# Patient Record
Sex: Female | Born: 1960 | Race: White | Smoking: Former smoker
Health system: Northeastern US, Academic
[De-identification: ages and names within clinical notes are randomized; demographics above are authoritative.]

## PROBLEM LIST (undated history)

## (undated) DIAGNOSIS — F32A Depression, unspecified: Secondary | ICD-10-CM

## (undated) DIAGNOSIS — M1990 Unspecified osteoarthritis, unspecified site: Secondary | ICD-10-CM

## (undated) DIAGNOSIS — E079 Disorder of thyroid, unspecified: Secondary | ICD-10-CM

## (undated) DIAGNOSIS — E7800 Pure hypercholesterolemia, unspecified: Secondary | ICD-10-CM

---

## 2008-07-09 DIAGNOSIS — F341 Dysthymic disorder: Secondary | ICD-10-CM | POA: Insufficient documentation

## 2009-04-30 ENCOUNTER — Encounter: Payer: Self-pay | Admitting: Gastroenterology

## 2010-03-11 NOTE — Miscellaneous (Unsigned)
Continuity of Care Record  Created: todo  From: Ignacia Felling  From:   From: [employer], EHR [id number]  To: Ova Freshwater, Dustie  Purpose: Patient Use;       Problems  Diagnosis: Dysthymic Disorder (300.4)     Alerts  Allergy - Latex-asked/denied   Allergy - No Known Drug Allergy     Medications  Loestrin 1.5/30 (21) 1.5-30 MG-MCG Tablet; TAKE 1 TABLET DAILY FOR 21 DAYS,   THEN 7 TABLET-FREE DAYS; REPEAT. ; Rx   Microgestin 1.5/30 1.5-30 MG-MCG Tablet; TAKE 1 TABLET BY MOUTH DAILY FOR   21 DAYS , THEN 7 TABLET - FREE DAYS ,THEN REPEAT ; Rx   Microgestin FE 1.5/30 1.5-30 MG-MCG Tablet; TAKE 1 TABLET BY MOUTH DAILY   FOR 21 DAYS , THEN 7 TABLET - FREE DAYS ,THEN REPEAT ; Rx   Non-medication order(s); Referred to orthopedics to evaluate and treat pain   to hep secondary to arthritis ; Rx   Ortho Evra 150-20 MCG/24HR Patch Weekly; APPLY ONE PATCH WEEKLY AS DIRECTED   ; Rx   Sertraline HCl 50 MG Tablet; TAKE 1 TABLET DAILY. ; Rx

## 2013-10-19 ENCOUNTER — Ambulatory Visit: Admit: 2013-10-19 | Discharge: 2013-10-19 | Disposition: A | Discharge status: Home / Self Care | Payer: Self-pay

## 2013-10-19 DIAGNOSIS — J029 Acute pharyngitis, unspecified: Secondary | ICD-10-CM

## 2013-10-19 HISTORY — DX: Disorder of thyroid, unspecified: E07.9

## 2013-10-19 LAB — POCT AMBULATORY RAPID STREP
Lot #: 151181
Rapid Strep Group A Throat-POC: NEGATIVE

## 2013-10-19 LAB — HM HIV SCREENING OFFERED

## 2013-10-19 NOTE — Discharge Instructions (Signed)
:   Patient was advised to utilize good handwashing and monitor signs and symptoms and followup with her primary Dr. if the signs and symptoms worsen or persist a culture will be sent to the lab for confirmation of the negative strep.  Patient take Tylenol or Motrin as needed for any pain or fever.  She should utilize good handwashing and followup with her primary Dr. with any concerns.

## 2013-10-19 NOTE — UC Provider Note (Signed)
History     Chief Complaint   Patient presents with    Sore Throat     HPI Comments: Patient is a 53 year old female who presents to the urgent care today with a sore throat since this morning no fever      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 visit related to civilian activity for income:  Not work related      Past Medical History   Diagnosis Date    Thyroid disease             History reviewed. No pertinent past surgical history.    History reviewed. No pertinent family history.      Social History      reports that she has never smoked. She does not have any smokeless tobacco history on file. She reports that she drinks alcohol. She reports that she does not use illicit drugs. Her sexual activity history is not on file.    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 Negative for fever and chills.   HENT: Positive for congestion, postnasal drip and sore throat. Negative for dental problem, drooling, facial swelling, hearing loss, sneezing and trouble swallowing.    Eyes: Negative for pain, discharge and redness.   Respiratory: Negative for apnea, cough, shortness of breath, wheezing and stridor.    Cardiovascular: Negative for chest pain.   Gastrointestinal: Negative for nausea, vomiting, diarrhea, constipation, blood in stool and abdominal distention.   Endocrine: Negative for cold intolerance.   Genitourinary: Negative for frequency and difficulty urinating.   Musculoskeletal: Negative for back pain, neck pain and neck stiffness.   Skin: Negative for color change and rash.   Allergic/Immunologic: Negative for environmental allergies.   Neurological: Negative for dizziness, seizures, speech difficulty, weakness, numbness and  headaches.   Psychiatric/Behavioral: Negative for sleep disturbance and agitation. The patient is not nervous/anxious.    All other systems reviewed and are negative.      Physical Exam     ED Triage Vitals   BP Pulse Heart Rate(via Pulse Ox) Resp Temp Temp Source SpO2 O2 Device O2 Flow Rate   10/19/13 1039 -- -- 10/19/13 1039 10/19/13 1039 10/19/13 1039 10/19/13 1039 10/19/13 1039 --   113/80 mmHg   10 36.8 C (98.2 F) Oral 98 % None (Room air)       Weight           10/19/13 1039           70.308 kg (155 lb)               Physical Exam   Constitutional: She is oriented to person, place, and time. She appears well-developed and well-nourished.   HENT:   Head: Normocephalic and atraumatic.   Mouth/Throat: No oropharyngeal exudate.   Scant nasal congestion when postnasal drip.  The posterior pharynx appears with mild erythema no exudate noted.  Neck is supple with full range of motion.  Anterior cervical lymphadenopathy is noted with mild tenderness with palpation no rigidity of the neck.   Eyes: Conjunctivae and EOM  are normal. Pupils are equal, round, and reactive to light. Right eye exhibits no discharge. Left eye exhibits no discharge.   Neck: Normal range of motion. Neck supple. No JVD present.   Cardiovascular: Normal rate, regular rhythm, normal heart sounds and intact distal pulses.    Pulmonary/Chest: Effort normal and breath sounds normal. No stridor. No respiratory distress. She has no wheezes.   Abdominal: Soft. Bowel sounds are normal. She exhibits no distension. There is no tenderness. There is no rebound.   Musculoskeletal: Normal range of motion.   Lymphadenopathy:     She has cervical adenopathy.   Neurological: She is alert and oriented to person, place, and time. She has normal reflexes.   Skin: Skin is warm.   Psychiatric: She has a normal mood and affect. Her behavior is normal. Judgment and thought content normal.   Nursing note and vitals reviewed.      Medical Decision Making      Amount  and/or Complexity of Data Reviewed  Clinical lab tests: ordered and reviewed (Habits strep test was ordered and reviewed culture will be sent)        Initial Evaluation:  ED First Provider Contact     Date/Time Event User Comments    10/19/13 1036 ED Provider First Contact Selina CooleyOURTNEY, Samael Blades Initial Face to Face Provider Contact          Patient seen by me as above    Assessment:  53 y.o., female comes to the [facility] Care [facility] with scant nasal congestion and sore throat since this morning.  Patient has no fever on exam she is scant congestion with postnasal drip, mild erythema in the posterior pharynx no exudate noted.  Tonsils are +1 with mild erythema.  Neck is supple with full range of motion there is anterior cervical lymphadenopathy is noted but no rigidity mild tenderness to the touch.  Heart sounds are normal no murmur, chest is clear to air.  Abdomen soft nontender nondistended with good bowel sounds.    Differential Diagnosis includes   Viral pharyngitis  Strep pharyngitis  Viral syndrome             Plan: Patient was advised to utilize good handwashing and monitor signs and symptoms and followup with her primary Dr. if the signs and symptoms worsen or persist a culture will be sent to the lab for confirmation of the negative strep.  Patient take Tylenol or Motrin as needed for any pain or fever.  She should utilize good handwashing and followup with her primary Dr. with any concerns.    Selina CooleyKathleen Azlynn Mitnick, NP

## 2013-10-19 NOTE — ED Notes (Signed)
States she began with sore throat this AM, thought she had allergies last eve, took allergy pill. Has not taken temp.  No meds taken

## 2013-10-21 LAB — STREP A CULTURE, THROAT: Group A Strep Throat Culture: 0

## 2014-05-29 ENCOUNTER — Encounter: Payer: Self-pay | Admitting: Emergency Medicine

## 2014-05-29 ENCOUNTER — Ambulatory Visit: Admit: 2014-05-29 | Discharge: 2014-05-29 | Disposition: A | Discharge status: Home / Self Care | Payer: Self-pay

## 2014-05-29 DIAGNOSIS — B001 Herpesviral vesicular dermatitis: Secondary | ICD-10-CM

## 2014-05-29 DIAGNOSIS — R519 Headache, unspecified: Secondary | ICD-10-CM

## 2014-05-29 LAB — HM HIV SCREENING OFFERED

## 2014-05-29 MED ORDER — VALACYCLOVIR HCL 1000 MG PO TABS *I*
1000.0000 mg | ORAL_TABLET | Freq: Three times a day (TID) | ORAL | Status: DC
Start: 2014-05-29 — End: 2014-05-30

## 2014-05-29 NOTE — ED Notes (Signed)
C/o "intermittant,stabbing headache" since Sun [place] left occiput area. No meds this AM, has been taking excedrine and  Something her daughter gave her.

## 2014-05-29 NOTE — UC Provider Note (Signed)
History     Chief Complaint   Patient presents with    Headache     HPI Comments: Patient is a 54 year old female who presents to the urgent care today with a headache.  The headache comes on intermittently and quickly last 2-3 seconds and then goes away she also has sensitivity of her scalp on the left side.  She has one lesion in the center of the scalp in the occipital region that she says it's been there for a long period of time.  The left side of the head is presented to the touch.      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 visit related to civilian activity for income:  Not work related      Past Medical History   Diagnosis Date    Thyroid disease             No past surgical history on file.    No family history on file.      Social History      reports that she has never smoked. She does not have any smokeless tobacco history on file. She reports that she drinks alcohol. She reports that she does not use illicit drugs. Her sexual activity history is not on file.    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 Negative for fever and chills.   HENT: Positive for congestion and ear pain. Negative for dental problem, drooling, facial swelling, hearing loss, sneezing, sore throat and trouble swallowing.    Eyes: Negative for pain, discharge and redness.   Respiratory: Negative for apnea, cough, shortness of breath, wheezing and stridor.    Cardiovascular: Negative for chest pain.   Gastrointestinal: Negative for nausea, vomiting, diarrhea, constipation, blood in stool and abdominal distention.   Endocrine: Negative for cold intolerance.   Genitourinary: Negative for frequency and difficulty urinating.   Musculoskeletal: Negative  for back pain, neck pain and neck stiffness.   Skin: Positive for wound (patient has a cold sore on the left lower lip). Negative for color change and rash.   Allergic/Immunologic: Negative for environmental allergies.   Neurological: Negative for dizziness, seizures, speech difficulty, weakness, numbness and headaches.   Psychiatric/Behavioral: Negative for sleep disturbance and agitation. The patient is not nervous/anxious.    All other systems reviewed and are negative.      Physical Exam     ED Triage Vitals   BP Pulse Heart Rate (via Pulse Ox) Resp Temp Temp src SpO2 O2 Device O2 Flow Rate   05/29/14 1018 -- 05/29/14 1018 05/29/14 1018 05/29/14 1018 05/29/14 1018 05/29/14 1018 05/29/14 1018 --   109/70 mmHg  67 12 36.8 C (98.2 F) Oral 97 % None (Room air)       Weight           05/29/14 1018           72.576 kg (160 lb)               Physical Exam   Constitutional: She is oriented to person, place, and  time. She appears well-developed and well-nourished. She appears distressed (during the period that the pain comes in the head patient is wincing).   HENT:   Head: Normocephalic and atraumatic.   Right Ear: External ear normal.   Nose: Nose normal.   Mouth/Throat: Oropharynx is clear and moist. No oropharyngeal exudate.   2 slight postnasal drip along with fluid behind each of the eardrums of light is present bilaterally.  No erythema.   Eyes: Conjunctivae and EOM are normal. Pupils are equal, round, and reactive to light. Right eye exhibits no discharge. Left eye exhibits no discharge.   Neck: Normal range of motion. Neck supple. No JVD present.   Cardiovascular: Normal rate, regular rhythm, normal heart sounds and intact distal pulses.    Pulmonary/Chest: Effort normal and breath sounds normal. No stridor. No respiratory distress. She has no wheezes.   Abdominal: Soft. Bowel sounds are normal. She exhibits no distension and no mass. There is no tenderness. There is no rebound and no guarding.    Musculoskeletal: Normal range of motion.   Lymphadenopathy:     She has no cervical adenopathy.   Neurological: She is alert and oriented to person, place, and time. She has normal reflexes.   Intermittent pain on the left side of the scalp.  The area hurts when touched there is no lesions present   Skin: Skin is warm.   Psychiatric: She has a normal mood and affect. Her behavior is normal. Judgment and thought content normal.   Nursing note and vitals reviewed.      Medical Decision Making        Initial Evaluation:  ED First Provider Contact     Date/Time Event User Comments    05/29/14 1026 ED Provider First Contact Selina Cooley Initial Face to Face Provider Contact          Patient seen by me as above    Assessment:  54 y.o., female comes to the Urgent Care Center with intermittent pain in the head that lasts only a matter of seconds but calms 2-3 times a minute.  This is a sharp stabbing burning type pain patient states that the left-sided scalp was also sensitive to touch.  Patient also has a cold sore on the left lower lip.  TMs have fluid behind them bilaterally she has slight nasal congestion also    Differential Diagnosis includes   Stress  Possible arterial or vascular malformation  Herpes simplex 1  Shingles  Neuropathic pain               Plan: Patient was advised that she should go the emergency department for further evaluation and treatment of the head pain she is experiencing.  She'll also be started on a course of Valtrex for the cold sore.  She'll take that 3 times a day for one week.  She should monitor the symptoms closely she was advised that our recommendation would be to go by ambulance she declined a recommendation and opted to transport herself via private vehicle.  Patient will go directly to emergency department for further evaluation and treatment.      Selina Cooley, NP

## 2014-05-29 NOTE — Discharge Instructions (Signed)
Patient was advised that she should go the emergency department for further evaluation and treatment of the head pain she is experiencing.  She'll also be started on a course of Valtrex for the cold sore.  She'll take that 3 times a day for one week.  She should monitor the symptoms closely she was advised that our recommendation would be to go by ambulance she declined a recommendation and opted to transport herself via private vehicle.  Patient will go directly to emergency department for further evaluation and treatment.

## 2014-05-30 MED ORDER — VALACYCLOVIR HCL 1000 MG PO TABS *I*
1000.0000 mg | ORAL_TABLET | Freq: Three times a day (TID) | ORAL | Status: AC
Start: 2014-05-30 — End: 2014-06-06

## 2015-01-03 ENCOUNTER — Ambulatory Visit
Admission: AD | Admit: 2015-01-03 | Discharge: 2015-01-03 | Disposition: A | Discharge status: Home / Self Care | Payer: Self-pay

## 2015-01-03 DIAGNOSIS — S20211A Contusion of right front wall of thorax, initial encounter: Secondary | ICD-10-CM

## 2015-01-03 HISTORY — DX: Pure hypercholesterolemia, unspecified: E78.00

## 2015-01-03 HISTORY — DX: Depression, unspecified: F32.A

## 2015-01-03 HISTORY — DX: Unspecified osteoarthritis, unspecified site: M19.90

## 2015-01-03 NOTE — ED Triage Notes (Signed)
States she fell 11/2 hr ago  After tipping off of sidewalk. C/o rightlateral rib pain, no other njury. Pt refused ambulance at time.  Ibuprofen 400 mgm 20 min ago       Triage Note   [name], RN

## 2015-01-03 NOTE — Discharge Instructions (Signed)
Return for worsening pain, shortness of breath or other concerns  Follow up with PCP in 1-2 days  Motrin 600mg  every 6 hours as needed with food  Warm heat to sore areas 2-3 times a day  Encourage coughing and deep breathing to avoid splinting and getting pneumonia.

## 2015-01-03 NOTE — ED Notes (Signed)
Upon disch, pt c/o left foot tingling, has had tingling in past, not this much. Does not want to see provider for same because she will see her own MD in a few days and provider will say it is the arthritis. Pt feels her body is swelling and will just take more ibuprofen

## 2015-01-03 NOTE — ED Triage Notes (Signed)
Pt keeps stating the BP cuff hurts her arm and fingers are numb while cuff inflated. Pt has rambling speech and states pain worse with movement       Triage Note   Cherlyn LabellaEileen B Brenten Janney, RN

## 2015-01-03 NOTE — UC Provider Note (Signed)
History     Chief Complaint   Patient presents with    Rib Injury     HPI Comments: This is a 54 y/o female PMH OA, hypothyroidism, that presents with right rib cage pain, s/p fall 1 hour PTA. PAtient reports she was on curb and fell striking right side of chest on curb. Denies striking her head, no LOC. C/O pain with movement, inspiration. Denies SOB. Denies HA, no N/V/. Denies vomiting. No other c/o pain, patient concerned as supposed to fly back to [place] this evening.       History provided by:  Patient      Past Medical History   Diagnosis Date    Arthritis     Depression     High cholesterol     Thyroid disease             History reviewed. No pertinent past surgical history.    No family history on file.      Social History    reports that she has quit smoking. She does not have any smokeless tobacco history on file. She reports that she drinks alcohol. She reports that she does not use illicit drugs. Her sexual activity history is not on file.    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 Negative for chills and fever.   HENT: Negative for congestion.    Respiratory: Negative for cough and shortness of breath.    Cardiovascular: Positive for chest pain.        Right chest wall pain   Gastrointestinal: Negative for abdominal pain, nausea and vomiting.   Genitourinary: Negative for difficulty urinating.   Musculoskeletal: Negative for back pain and neck pain.   Skin: Negative for color change, rash and wound.   Neurological: Negative for dizziness, syncope and light-headedness.   Hematological: Does not bruise/bleed easily.   Psychiatric/Behavioral: Negative for confusion.       Physical Exam     ED Triage Vitals   BP Heart Rate Heart Rate (via Pulse Ox)  Resp Temp Temp src SpO2 O2 Device O2 Flow Rate   01/03/15 1144 01/03/15 1144 01/03/15 1144 01/03/15 1144 01/03/15 1144 01/03/15 1144 01/03/15 1144 01/03/15 1144 --   129/81 66 66 12 36.6 C (97.9 F) Oral 99 % None (Room air)       Weight           01/03/15 1144           71.2 kg (157 lb)               Physical Exam   Constitutional: She is oriented to person, place, and time. She appears well-developed and well-nourished. No distress.   HENT:   Head: Normocephalic and atraumatic.   Mouth/Throat: Oropharynx is clear and moist.   Eyes: EOM are normal. Pupils are equal, round, and reactive to light.   Neck: No JVD present.   Cardiovascular: Normal rate, regular rhythm, normal heart sounds and intact distal pulses.        Pulmonary/Chest: Effort normal and breath sounds normal. No respiratory distress. She exhibits tenderness.   Abdominal:  Soft. She exhibits no mass. There is no tenderness. There is no rebound and no guarding. No hernia.   Musculoskeletal: Normal range of motion.   Neurological: She is alert and oriented to person, place, and time.   Skin: Skin is warm and dry.   Psychiatric: She has a normal mood and affect.   Nursing note and vitals reviewed.       Medical Decision Making      Amount and/or Complexity of Data Reviewed  Tests in the radiology section of CPT: ordered        Initial Evaluation:  ED First Provider Contact     Date/Time Event User Comments    01/03/15 1141 ED Provider First Contact Katherene PontoFENTON, ANGELA E Initial Face to Face Provider Contact          Patient seen by me as above    Assessment:  54 y.o.female comes to the Urgent Care Center with right rib cage pain s/p fall one hour PTA. Denies striking head, no LOC. PAin with movement, no SOB. Not hypoxic. No other injuries present.    Differential Diagnosis includes   PTX  Rib Fx  Rib cage contusion                Plan:   EXAM  Motrin   ICE  FU PCP  CONSIDER MUSCLE RELAXANTS    Final Diagnosis  Final diagnoses:   None           Basilia JumboANGIE Naren Benally,  NP    Supervising physician MATTHEW SPENCER was immediately available    CXR negative for rib fx, PTX. Declined muscle relaxants, motrin PRN. FU PCP.      Basilia JumboFenton, Dessa Ledee, NP  01/03/15 1233

## 2015-08-26 ENCOUNTER — Ambulatory Visit
Admission: AD | Admit: 2015-08-26 | Discharge: 2015-08-26 | Disposition: A | Discharge status: Home / Self Care | Payer: Self-pay

## 2015-08-26 DIAGNOSIS — S5002XA Contusion of left elbow, initial encounter: Secondary | ICD-10-CM

## 2015-08-26 DIAGNOSIS — S40012A Contusion of left shoulder, initial encounter: Secondary | ICD-10-CM

## 2015-08-26 NOTE — Discharge Instructions (Signed)
Recommend otc Tylenol and/or Motrin as needed for pain/fever relief. Use as directed on package labeling.

## 2015-08-26 NOTE — ED Notes (Signed)
Pt given a sling to wear when she got home and was sized for her  And ice applied

## 2015-08-26 NOTE — UC Provider Note (Signed)
History     Chief Complaint   Patient presents with    Fall     Patient is a 55 y.o. female presenting with arm injury.   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 visit related to civilian activity for income:  Not work related  Arm Injury   Location:  Elbow and shoulder  Shoulder location:  L shoulder  Elbow location:  L elbow  Injury: yes    Time since incident:  1 hour  Mechanism of injury comment:  Patient fell on a toy and landed on her left arm at home today  Pain details:     Quality:  Aching    Radiates to:  L fingers    Severity:  Moderate    Onset quality:  Sudden    Timing:  Constant    Progression:  Improving  Handedness:  Right-handed  Dislocation: no    Foreign body present:  No foreign bodies  Prior injury to area:  No  Relieved by:  Rest  Worsened by:  Movement  Associated symptoms: decreased range of motion, numbness, stiffness and tingling    Associated symptoms: no back pain, no fatigue, no fever, no muscle weakness, no neck pain and no swelling        Past Medical History:   Diagnosis Date    Arthritis     Depression     High cholesterol     Thyroid disease             History reviewed. No pertinent surgical history.    History reviewed. No pertinent family history.      Social History    reports that she has quit smoking. She has never used smokeless tobacco. She reports that she drinks alcohol. She reports that she does not use illicit drugs. Her sexual activity history is not on file.    Living Situation     Questions Responses    Patient lives with Significant Other    Homeless     Caregiver for other family member     External Services     Employment     Domestic Violence Risk           Review of Systems   Review of Systems   Constitutional: Negative for fatigue and fever.   Musculoskeletal: Positive for stiffness. Negative for back pain and neck pain.        Left shoulder and elbow pain   Neurological: Positive for numbness (in left fingers).   All  other systems reviewed and are negative.      Physical Exam     ED Triage Vitals   BP Heart Rate Heart Rate (via Pulse Ox) Resp Temp Temp src SpO2 O2 Device O2 Flow Rate   08/26/15 1555 08/26/15 1555 -- 08/26/15 1555 08/26/15 1555 08/26/15 1555 08/26/15 1555 08/26/15 1555 --   126/58 67  16 37.6 C (99.7 F) TEMPORAL 96 % None (Room air)       Weight           08/26/15 1555           72.6 kg (160 lb)                    Physical Exam   Constitutional: She is oriented to person, place, and time. She appears well-developed and well-nourished.   HENT:   Head: Normocephalic and atraumatic.   Cardiovascular: Intact  distal pulses.    Pulmonary/Chest: Effort normal.   Musculoskeletal:        Left shoulder: She exhibits decreased range of motion, bony tenderness and pain. She exhibits no swelling, no effusion, no crepitus, no deformity, no spasm, normal pulse and normal strength.        Left elbow: She exhibits decreased range of motion. She exhibits no swelling, no effusion, no deformity and no laceration. Tenderness found. Lateral epicondyle and olecranon process tenderness noted.        Thoracic back: Normal.        Lumbar back: Normal.        Left hand: Normal.   Neurological: She is alert and oriented to person, place, and time. She has normal strength. No sensory deficit.   Skin: Skin is warm and dry. No rash noted.   Psychiatric: She has a normal mood and affect. Her behavior is normal. Judgment and thought content normal.   Nursing note and vitals reviewed.       Medical Decision Making      Amount and/or Complexity of Data Reviewed  Tests in the radiology section of CPT: ordered and reviewed  Independent visualization of images, tracings, or specimens: yes        Initial Evaluation:  ED First Provider Contact     Date/Time Event User Comments    08/26/15 1606 ED Provider First Contact Alysia PennaUIZ, Romney Compean M Initial Face to Face Provider Contact          Patient was seen on: 08/26/2015        Assessment:  55 y.o.female [facility]  [facility] with left elbow and shoulder pain x 1 hour due to a fall onto her left arm.    Differential Diagnosis includes Contusion, Fracture, Ligamentous sprain, Tendon Strain                 Plan:   Orders Placed This Encounter    Sling & swathe    * Elbow LEFT standard AP, Lateral,  and both Obliques    * Shoulder LEFT standard AP, Grashey, and Lateral views     Orders Placed This Encounter   Procedures    Sling & swathe     Standing Status:   Standing     Number of Occurrences:   1    * Elbow LEFT standard AP, Lateral,  and both Obliques     Standing Status:   Standing     Number of Occurrences:   1     Order Specific Question:   Signs and symptoms/indications     Answer:   pain x 1 hour after fall     Order Specific Question:   Patient pregnant     Answer:   No    * Shoulder LEFT standard AP, Grashey, and Lateral views     Standing Status:   Standing     Number of Occurrences:   1     Order Specific Question:   Signs and symptoms/indications     Answer:   pain x 1 hour after fall     Order Specific Question:   Patient pregnant     Answer:   No       Xrays ordered and reviewed - no obvious acute fracture or dislocation; official radiology read pending at time of discharge, any deviation from plan at time of discharge will be conveyed telephonically.     Final Diagnosis    ICD-10-CM ICD-9-CM  1. Contusion of shoulder, left S40.012A 923.00   2. Contusion of elbow, left S50.02XA 923.11       Encourage fluids, encourage rest, good hand hygiene.    Use over the counter medications as discussed. Recommend otc Tylenol and/or Motrin as needed for pain/fever relief. Use as directed on package labeling.      Recommend rest, ice, elevation and compression of the left arm.    Please follow up with your physician as below:    Follow-up Information     Follow [facility] with [facility].    Specialty:  Emergency Medicine    Why:  As needed    Contact information:    [address]  [place]  [place] [id number]  [phone]        If short of breath, chest pains or any other concerns please report to the emergency room.    In the event of an Emergency dial 911.     Final Diagnosis  Final diagnoses:   [S40.012A] Contusion of shoulder, left (Primary)   [S50.02XA] Contusion of elbow, left           Concha PyoLeah M Marveen Donlon, PA    Supervising physician Dr. Dorna BloomSuzanne Brendze was immediately available     Concha PyoRuiz, Reneka Nebergall M, [place]  08/26/15 1644

## 2015-08-26 NOTE — ED Triage Notes (Signed)
Pt states that she fell backwards over a toy and landed on her left side.  She states that her left shoulder and left elbow are painful and her left forearm and left hand feel numb and tingling and did not think that she could move her fingers but realized that she could move her fingers. She does also complain that she has a pain on her left mid to upper left abd but she had a hot dog with hot meat sauce for lunch and she is not use to eating that stuff.  She also c/o that her left buttocks hurts Pt ambulated well. Ice applied now  None before.       Triage Note   Randell LoopEllen L Meegan Shanafelt, RN

## 2015-09-02 ENCOUNTER — Telehealth: Payer: Self-pay

## 2015-09-02 NOTE — Telephone Encounter (Signed)
PAST 5 DAYS OF SERVICE

## 2022-12-15 IMAGING — MR MRI LUMBAR SPINE WITHOUT CONTRAST
4 series · 48 of 48 positions shown · IV contrast (Off)
Comparison: Exam compared to prior exam from our facility dated 05/18/2019.

MRI OF THE LUMBAR SPINE WITHOUT CONTRAST
CLINICAL HISTORY: Chronic low back pain, right hip and leg pain with numbness.
TECHNIQUE: Multisequential multiplanar imaging was performed of the lumbar spinal region.

[Series 1: z s-c scano · coronal · 6.0mm · 1.17mm/px · 5 of 6 slices shown]
[im 1/6]
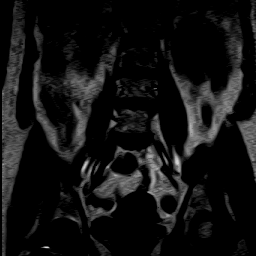
[im 2/6]
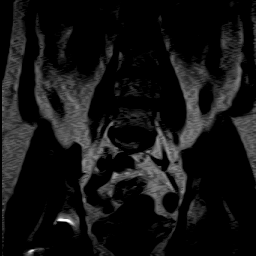
[im 3/6]
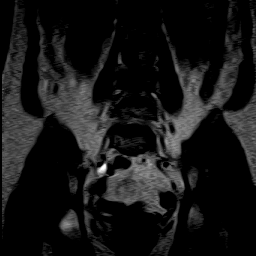
[im 4/6]
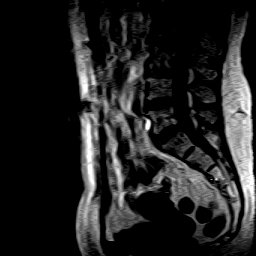
[im 6/6]
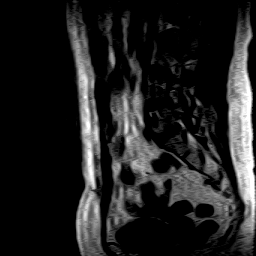

[Series 2: T2 · sagittal · 5.0mm · 1.13mm/px · 10 of 13 slices shown (1 of 2)]
[im 1/13]
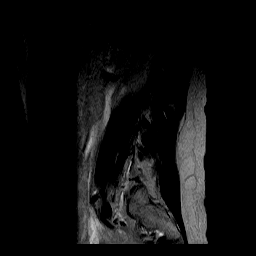
[im 2/13]
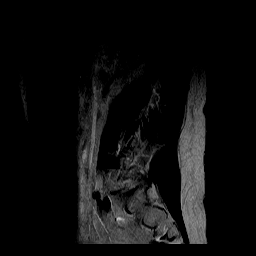
[im 3/13]
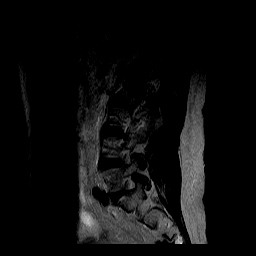
[im 5/13]
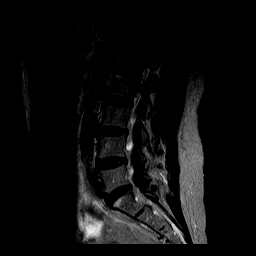
[im 6/13]
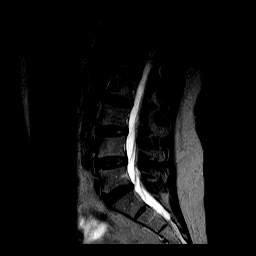
[im 7/13]
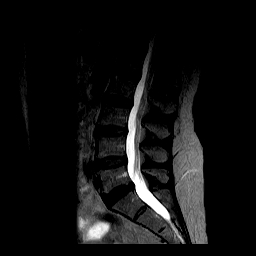
[im 9/13]
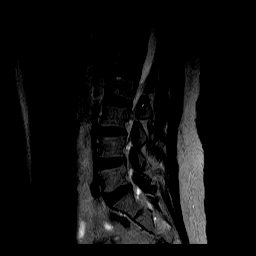
[im 10/13]
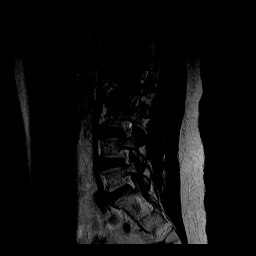
[im 11/13]
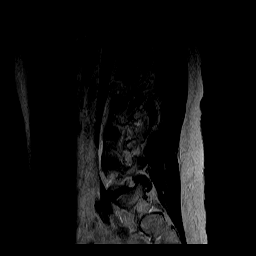
[im 13/13]
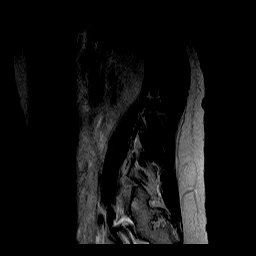

[Series 3: T1 · sagittal · 5.0mm · 1.13mm/px · 10 of 13 slices shown]
[im 1/13]
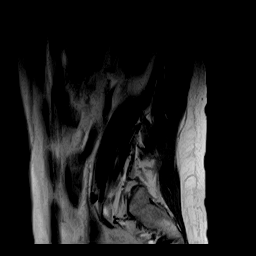
[im 2/13]
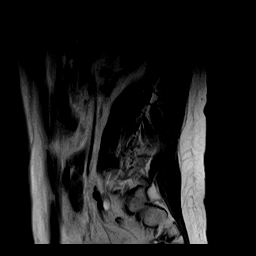
[im 3/13]
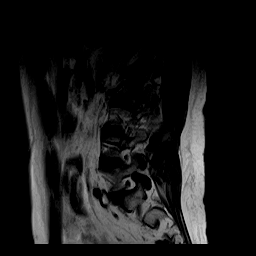
[im 5/13]
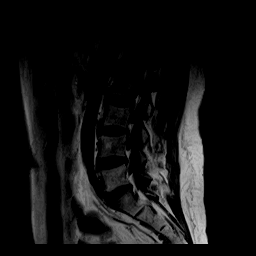
[im 6/13]
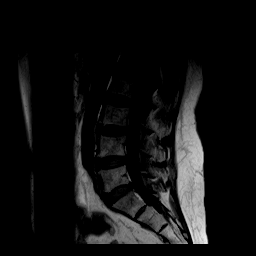
[im 7/13]
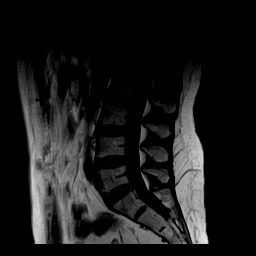
[im 9/13]
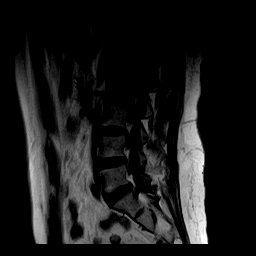
[im 10/13]
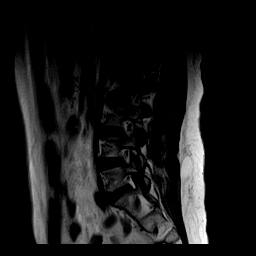
[im 11/13]
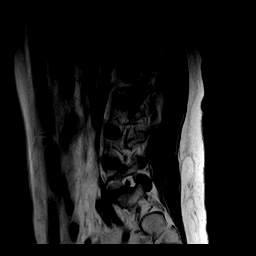
[im 13/13]
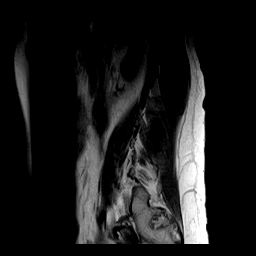

[Series 5: T2 · axial · 4.0mm · 1.25mm/px · z∈[-59,+161]mm · 23 of 29 slices shown (2 of 2)]
[im 1/29]
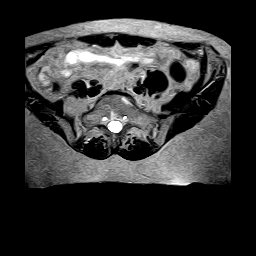
[im 2/29]
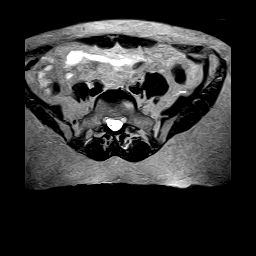
[im 3/29]
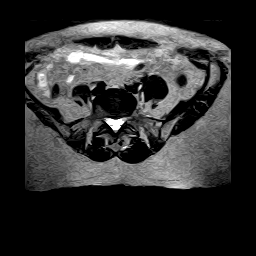
[im 4/29]
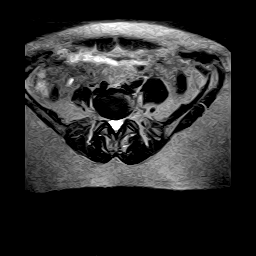
[im 6/29]
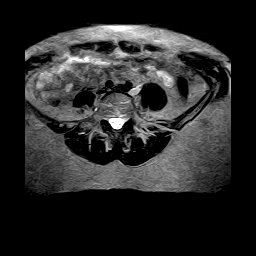
[im 7/29]
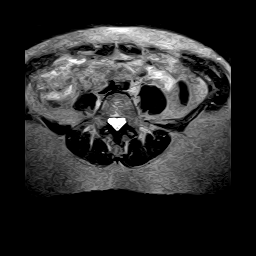
[im 8/29]
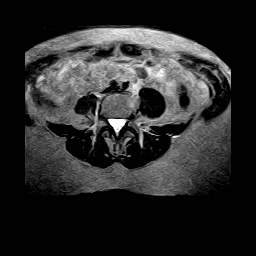
[im 9/29]
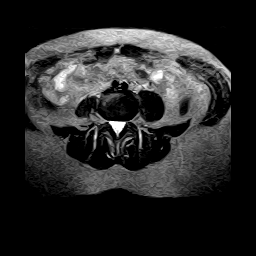
[im 11/29]
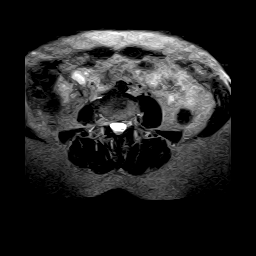
[im 12/29]
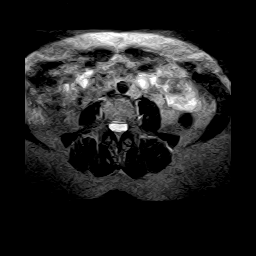
[im 13/29]
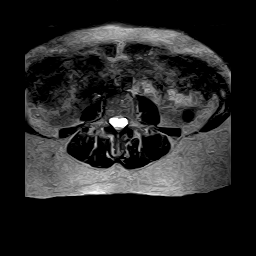
[im 15/29]
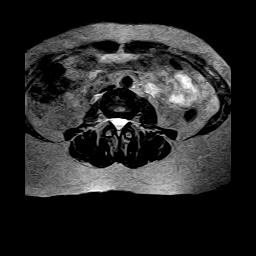
[im 16/29]
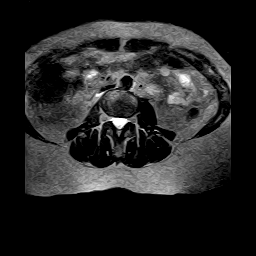
[im 17/29]
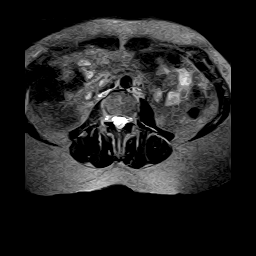
[im 18/29]
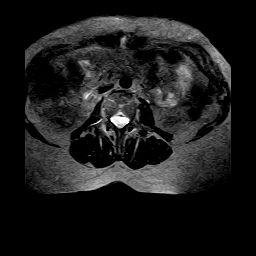
[im 20/29]
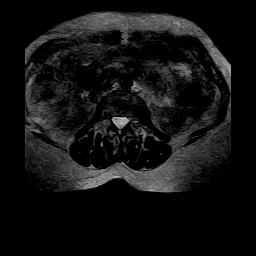
[im 21/29]
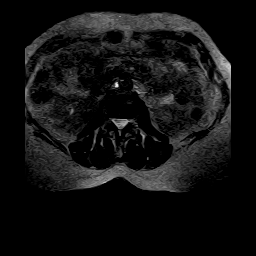
[im 22/29]
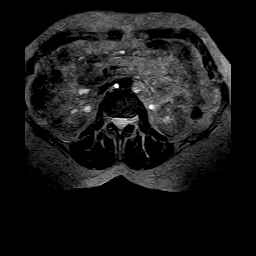
[im 23/29]
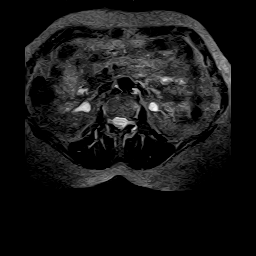
[im 25/29]
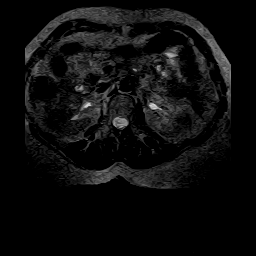
[im 26/29]
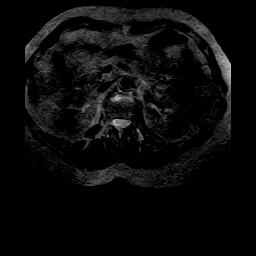
[im 27/29]
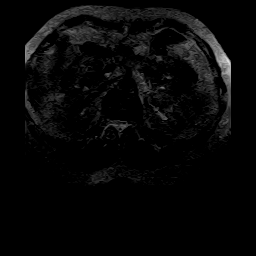
[im 29/29]
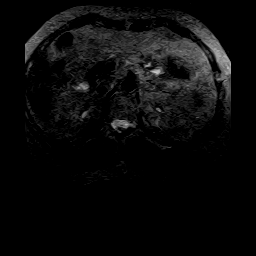

[48 of 48 positions shown; findings below may reference images not displayed]

FINDINGS: There is a normal marrow signal noted throughout the lumbar vertebral bodies. The conus medullaris is unremarkable and there is no obvious intradural abnormality noted. There is moderate facet arthrosis and ligamentum flavum hypertrophy throughout the lumbar spinal region. 

At L1-L2 level, there is desiccation and 2.5 mm bulging. 

At L2-L3 level, there is 2 mm bulging. 

At L3-L4 level, there is 1.5 mm bulging and borderline to early spinal stenosis. 

At L4-L5 level, there is 1.5 mm bulging and early spinal stenosis. 

At L5-S1 level, there is desiccation and 3 to 3.5 mm herniation asymmetric toward the right side with mild effacement of the entrance to the right neural foramen. Mild to moderate spinal stenosis.
IMPRESSION: Desiccation and 2.5 mm bulging at L1-L2, 2 mm bulging at L2-L3, and 1.5 mm bulging with borderline to early spinal stenosis at L3-L4. These levels appear stable. There is 1.5 mm bulging at L4-L5 with early spinal stenosis. Desiccation and 3 to 3.5 mm herniation asymmetric toward the right side at L5-S1 with mild effacement of the entrance to the right neural foramen and mild to moderate spinal stenosis. The spinal stenosis at L4-L5 and L5-S1 appears to have worsened since prior examination. This is likely related to worsening of the facet arthrosis.

## 8386-11-01 DEATH — deceased
